# Patient Record
Sex: Male | Born: 1985 | Race: White | Hispanic: No | Marital: Single | Smoking: Current every day smoker
Health system: Southern US, Community
[De-identification: ages and names within clinical notes are randomized; demographics above are authoritative.]

## PROBLEM LIST (undated history)

## (undated) DIAGNOSIS — M359 Systemic involvement of connective tissue, unspecified: Secondary | ICD-10-CM

## (undated) DIAGNOSIS — F410 Panic disorder [episodic paroxysmal anxiety] without agoraphobia: Secondary | ICD-10-CM

---

## 2004-10-22 ENCOUNTER — Emergency Department: Payer: Self-pay | Admitting: Unknown Physician Specialty

## 2004-11-11 ENCOUNTER — Emergency Department: Payer: Self-pay | Admitting: Emergency Medicine

## 2005-01-27 ENCOUNTER — Emergency Department (HOSPITAL_COMMUNITY): Admission: EM | Admit: 2005-01-27 | Discharge: 2005-01-27 | Payer: Self-pay | Admitting: Family Medicine

## 2006-11-29 ENCOUNTER — Emergency Department: Payer: Self-pay | Admitting: General Practice

## 2007-02-01 ENCOUNTER — Emergency Department: Payer: Self-pay | Admitting: Emergency Medicine

## 2007-04-13 ENCOUNTER — Emergency Department: Payer: Self-pay | Admitting: Emergency Medicine

## 2007-09-28 ENCOUNTER — Emergency Department: Payer: Self-pay | Admitting: Emergency Medicine

## 2007-10-05 ENCOUNTER — Emergency Department: Payer: Self-pay | Admitting: Emergency Medicine

## 2008-02-27 ENCOUNTER — Emergency Department: Payer: Self-pay | Admitting: Unknown Physician Specialty

## 2008-03-25 ENCOUNTER — Emergency Department: Payer: Self-pay | Admitting: Emergency Medicine

## 2008-05-05 ENCOUNTER — Emergency Department: Payer: Self-pay | Admitting: Emergency Medicine

## 2008-05-07 ENCOUNTER — Emergency Department: Payer: Self-pay | Admitting: Emergency Medicine

## 2009-11-29 ENCOUNTER — Emergency Department: Payer: Self-pay | Admitting: Emergency Medicine

## 2009-12-01 ENCOUNTER — Emergency Department: Payer: Self-pay | Admitting: Internal Medicine

## 2009-12-03 ENCOUNTER — Emergency Department: Payer: Self-pay | Admitting: Internal Medicine

## 2010-04-18 ENCOUNTER — Emergency Department (HOSPITAL_COMMUNITY): Admission: EM | Admit: 2010-04-18 | Discharge: 2010-04-18 | Payer: Self-pay | Admitting: Emergency Medicine

## 2010-04-27 ENCOUNTER — Emergency Department: Payer: Self-pay | Admitting: Emergency Medicine

## 2010-04-29 ENCOUNTER — Emergency Department (HOSPITAL_COMMUNITY): Admission: EM | Admit: 2010-04-29 | Discharge: 2010-04-29 | Payer: Self-pay | Admitting: Emergency Medicine

## 2010-04-30 ENCOUNTER — Inpatient Hospital Stay (HOSPITAL_COMMUNITY): Admission: EM | Admit: 2010-04-30 | Discharge: 2010-05-04 | Payer: Self-pay | Admitting: Internal Medicine

## 2010-06-28 ENCOUNTER — Emergency Department (HOSPITAL_COMMUNITY): Admission: EM | Admit: 2010-06-28 | Discharge: 2010-06-28 | Payer: Self-pay | Admitting: Emergency Medicine

## 2010-07-01 ENCOUNTER — Emergency Department: Payer: Self-pay | Admitting: Emergency Medicine

## 2010-07-23 ENCOUNTER — Emergency Department: Payer: Self-pay | Admitting: Emergency Medicine

## 2010-07-29 ENCOUNTER — Emergency Department: Payer: Self-pay | Admitting: Emergency Medicine

## 2010-07-31 ENCOUNTER — Emergency Department: Payer: Self-pay | Admitting: Emergency Medicine

## 2010-07-31 ENCOUNTER — Emergency Department (HOSPITAL_COMMUNITY)
Admission: EM | Admit: 2010-07-31 | Discharge: 2010-07-31 | Payer: Self-pay | Source: Home / Self Care | Admitting: Emergency Medicine

## 2010-11-16 LAB — CBC
HCT: 35.7 % — ABNORMAL LOW (ref 39.0–52.0)
HCT: 36.2 % — ABNORMAL LOW (ref 39.0–52.0)
Hemoglobin: 12.9 g/dL — ABNORMAL LOW (ref 13.0–17.0)
Hemoglobin: 13.2 g/dL (ref 13.0–17.0)
MCH: 30 pg (ref 26.0–34.0)
MCH: 30.1 pg (ref 26.0–34.0)
MCHC: 34.5 g/dL (ref 30.0–36.0)
MCHC: 34.7 g/dL (ref 30.0–36.0)
MCHC: 35.6 g/dL (ref 30.0–36.0)
MCV: 84.6 fL (ref 78.0–100.0)
MCV: 86.4 fL (ref 78.0–100.0)
Platelets: 156 10*3/uL (ref 150–400)
Platelets: 171 10*3/uL (ref 150–400)
RBC: 4.22 MIL/uL (ref 4.22–5.81)
RBC: 4.28 MIL/uL (ref 4.22–5.81)
RDW: 12 % (ref 11.5–15.5)
RDW: 12.2 % (ref 11.5–15.5)
WBC: 10.2 10*3/uL (ref 4.0–10.5)
WBC: 13.8 10*3/uL — ABNORMAL HIGH (ref 4.0–10.5)

## 2010-11-16 LAB — DIFFERENTIAL
Basophils Absolute: 0 10*3/uL (ref 0.0–0.1)
Basophils Relative: 0 % (ref 0–1)
Basophils Relative: 0 % (ref 0–1)
Basophils Relative: 0 % (ref 0–1)
Eosinophils Absolute: 0 10*3/uL (ref 0.0–0.7)
Eosinophils Relative: 0 % (ref 0–5)
Lymphocytes Relative: 9 % — ABNORMAL LOW (ref 12–46)
Lymphs Abs: 1.2 10*3/uL (ref 0.7–4.0)
Lymphs Abs: 1.2 10*3/uL (ref 0.7–4.0)
Monocytes Absolute: 1.2 10*3/uL — ABNORMAL HIGH (ref 0.1–1.0)
Monocytes Absolute: 2 10*3/uL — ABNORMAL HIGH (ref 0.1–1.0)
Monocytes Relative: 15 % — ABNORMAL HIGH (ref 3–12)
Neutro Abs: 5.9 10*3/uL (ref 1.7–7.7)
Neutrophils Relative %: 70 % (ref 43–77)

## 2010-11-16 LAB — COMPREHENSIVE METABOLIC PANEL
ALT: 24 U/L (ref 0–53)
AST: 28 U/L (ref 0–37)
Alkaline Phosphatase: 81 U/L (ref 39–117)
CO2: 30 mEq/L (ref 19–32)
Calcium: 8.5 mg/dL (ref 8.4–10.5)
Calcium: 8.9 mg/dL (ref 8.4–10.5)
Creatinine, Ser: 0.66 mg/dL (ref 0.4–1.5)
Creatinine, Ser: 0.89 mg/dL (ref 0.4–1.5)
GFR calc Af Amer: >60 mL/min (ref >60)
GFR calc Af Amer: >60 mL/min (ref >60)
GFR calc non Af Amer: >60 mL/min (ref >60)
Sodium: 135 mEq/L (ref 135–145)
Total Protein: 6.2 g/dL (ref 6.0–8.3)

## 2010-11-16 LAB — URINALYSIS, ROUTINE W REFLEX MICROSCOPIC
Protein, ur: 100 mg/dL — AB
Urobilinogen, UA: 4 mg/dL — ABNORMAL HIGH (ref 0.0–1.0)
pH: 6 (ref 5.0–8.0)

## 2010-11-16 LAB — BASIC METABOLIC PANEL
BUN: 5 mg/dL — ABNORMAL LOW (ref 6–23)
CO2: 27 mEq/L (ref 19–32)
Calcium: 8.5 mg/dL (ref 8.4–10.5)
Calcium: 9 mg/dL (ref 8.4–10.5)
GFR calc Af Amer: >60 mL/min (ref >60)
GFR calc Af Amer: >60 mL/min (ref >60)
GFR calc non Af Amer: >60 mL/min (ref >60)
Glucose, Bld: 102 mg/dL — ABNORMAL HIGH (ref 70–99)
Potassium: 3.6 mEq/L (ref 3.5–5.1)
Sodium: 133 mEq/L — ABNORMAL LOW (ref 135–145)
Sodium: 136 mEq/L (ref 135–145)

## 2010-11-16 LAB — CULTURE, BLOOD (ROUTINE X 2): Culture: NO GROWTH

## 2010-11-16 LAB — GC/CHLAMYDIA PROBE AMP, URINE: GC Probe Amp, Urine: POSITIVE — AB

## 2010-11-16 LAB — RPR: RPR Ser Ql: NONREACTIVE

## 2010-11-16 LAB — URINE MICROSCOPIC-ADD ON

## 2010-11-16 LAB — HEPATIC FUNCTION PANEL
ALT: 19 U/L (ref 0–53)
AST: 24 U/L (ref 0–37)
Alkaline Phosphatase: 60 U/L (ref 39–117)
Total Bilirubin: 1.3 mg/dL — ABNORMAL HIGH (ref 0.3–1.2)

## 2010-11-16 LAB — ABO/RH: ABO/RH(D): A POS

## 2010-11-16 LAB — URINE CULTURE
Culture  Setup Time: 201108290934
Culture: NO GROWTH

## 2010-11-16 LAB — STREP A DNA PROBE

## 2010-11-16 LAB — MAGNESIUM: Magnesium: 2 mg/dL (ref 1.5–2.5)

## 2011-04-10 ENCOUNTER — Emergency Department: Payer: Self-pay | Admitting: Emergency Medicine

## 2011-04-29 ENCOUNTER — Emergency Department: Payer: Self-pay | Admitting: Emergency Medicine

## 2011-05-16 ENCOUNTER — Emergency Department: Payer: Self-pay | Admitting: Emergency Medicine

## 2011-06-23 ENCOUNTER — Emergency Department: Payer: Self-pay | Admitting: Unknown Physician Specialty

## 2011-10-13 IMAGING — CR DG CHEST 2V
2 series · 2 of 2 positions shown · non-contrast
Comparison: None

CLINICAL DATA: Fever, sore throat.

CHEST - 2 VIEW

[w chest pa]
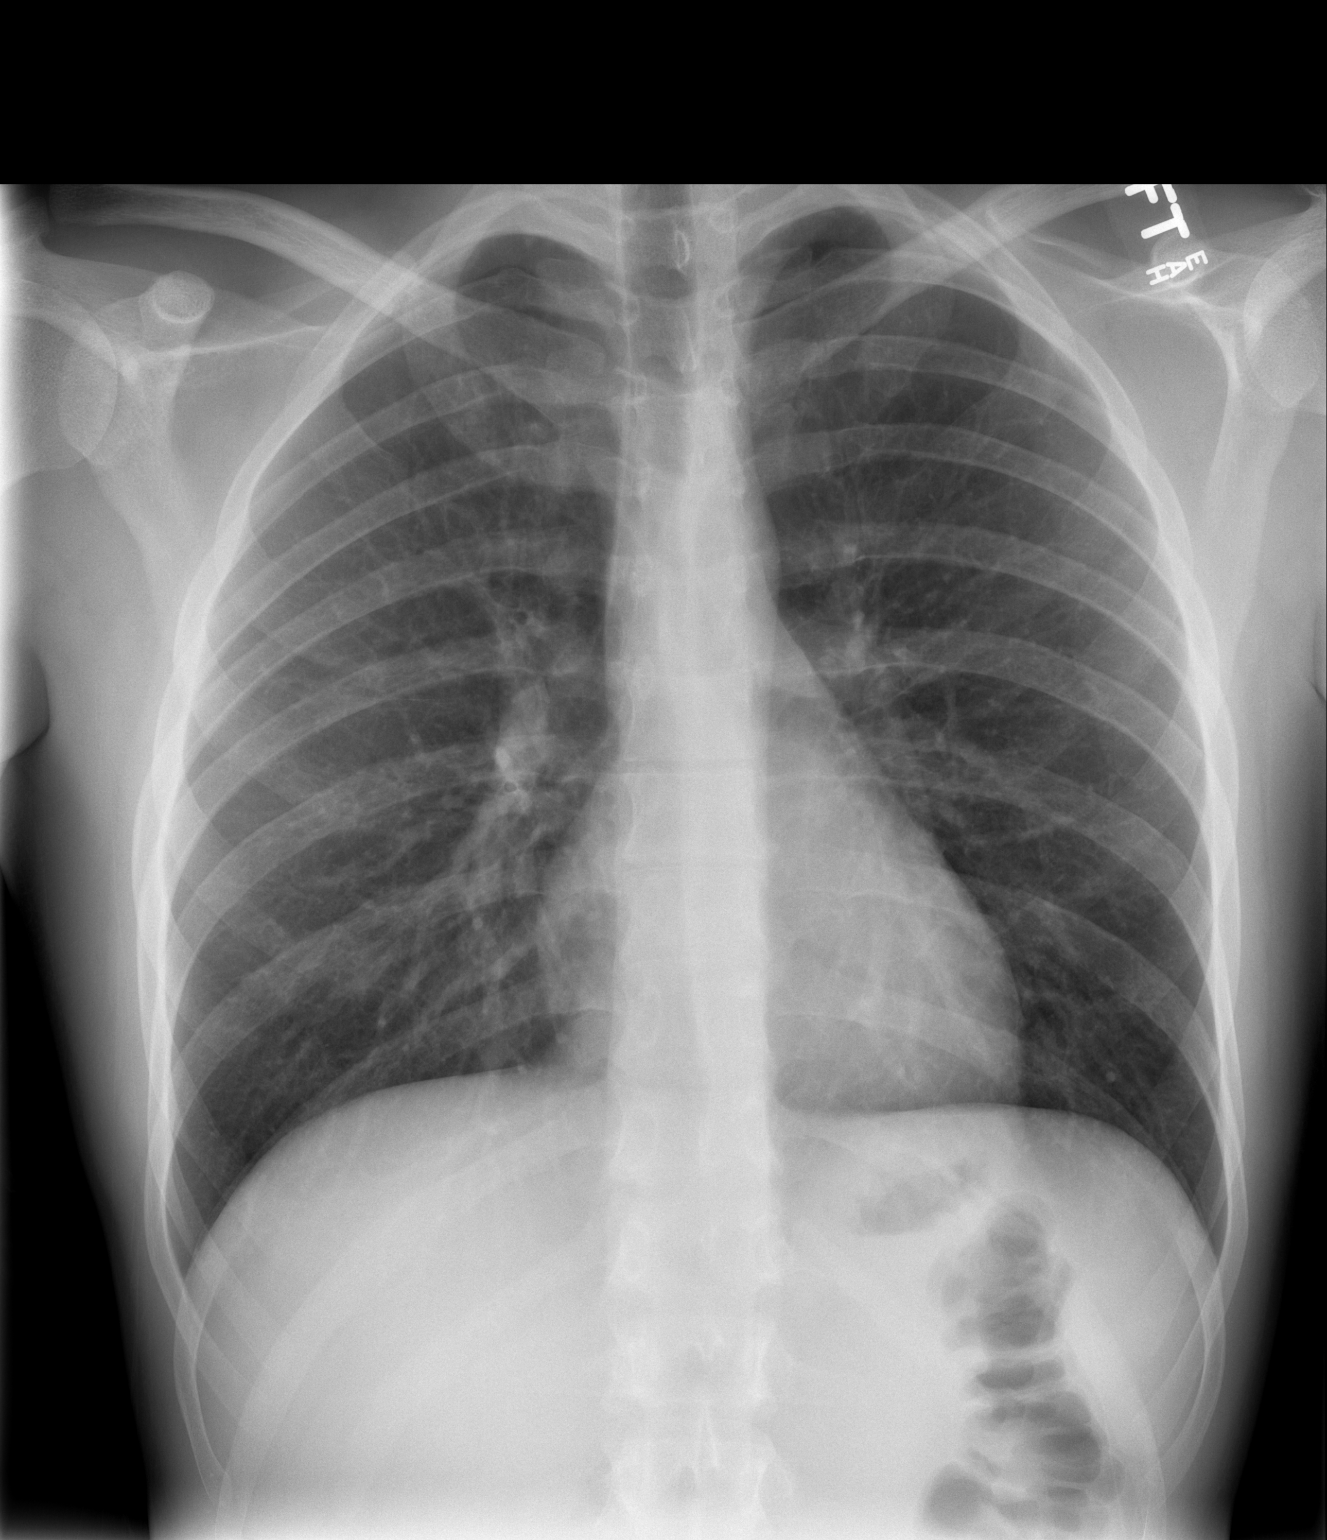

[w chest lat]
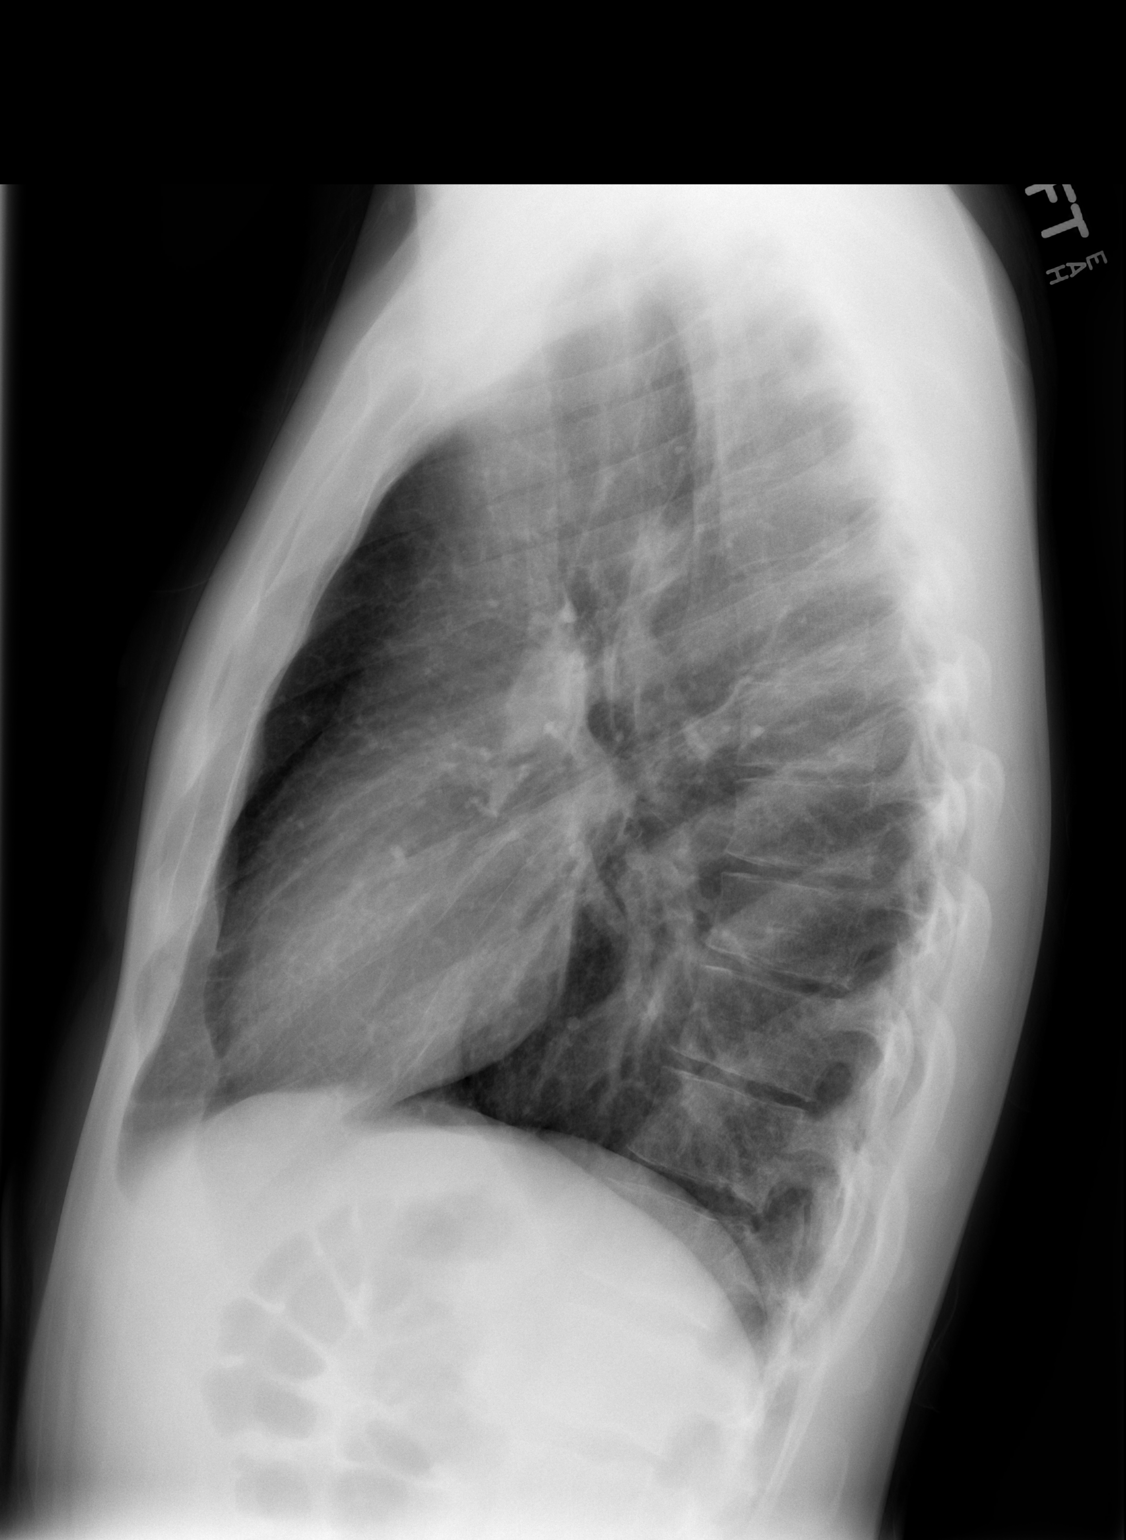

[2 of 2 positions shown; findings below may reference images not displayed]

FINDINGS: Heart and mediastinal contours are within normal limits.
No focal opacities or effusions.  No acute bony abnormality.
IMPRESSION: No active disease.

## 2011-10-14 IMAGING — CT CT ABD-PELV W/ CM
3 of 4 series · 16 of 46 positions shown, 20 images · IV contrast (APPLIED)
Comparison: None.

CLINICAL DATA: Hematemesis/nausea and vomiting

CT ABDOMEN AND PELVIS WITH CONTRAST
TECHNIQUE: Multidetector CT imaging of the abdomen and pelvis was
performed following the standard protocol during bolus
administration of intravenous contrast.
Contrast: 100 ml Pmnipaque-1BB

[Series 2: abd/pelv with 5.0 b31f st · axial · 0.68mm/px · z∈[-462,-57]mm · 12 of 93 slices shown, 16 images]
[im 8/93  soft-tissue]
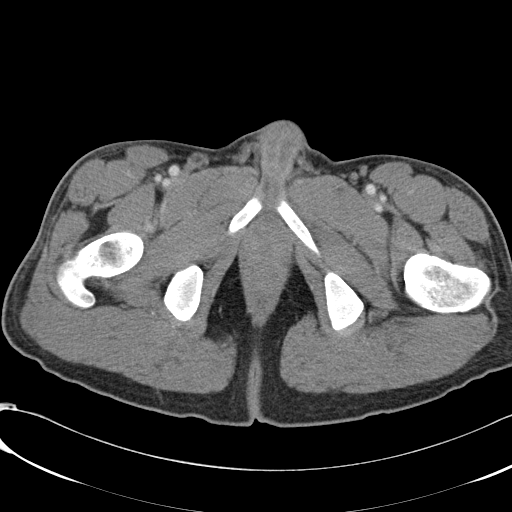
[im 8/93  bone]
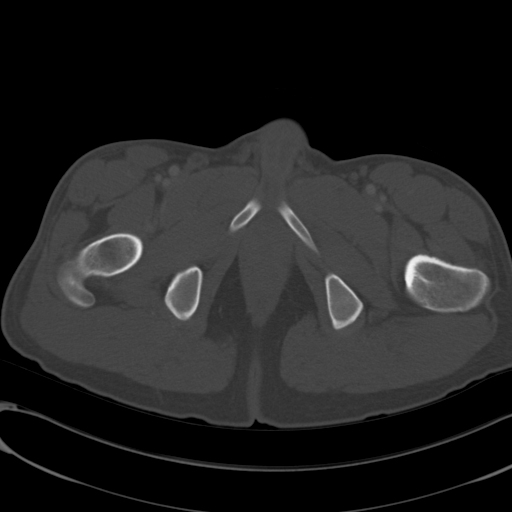
[im 16/93  soft-tissue]
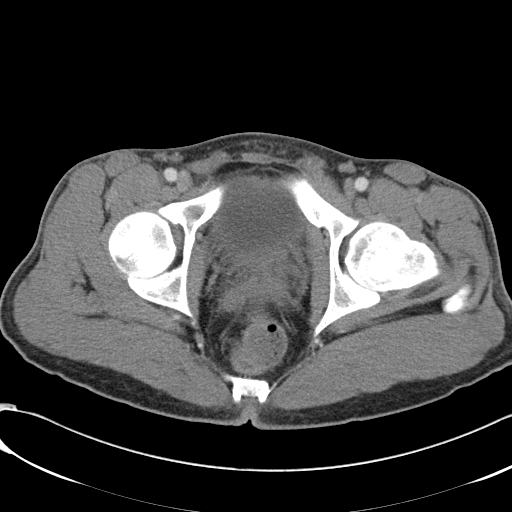
[im 24/93  soft-tissue]
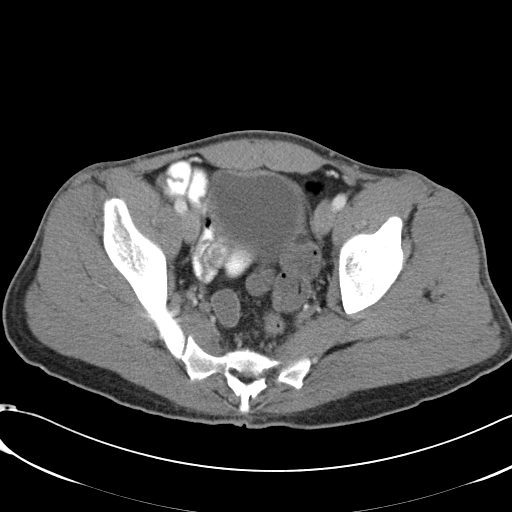
[im 35/93  soft-tissue]
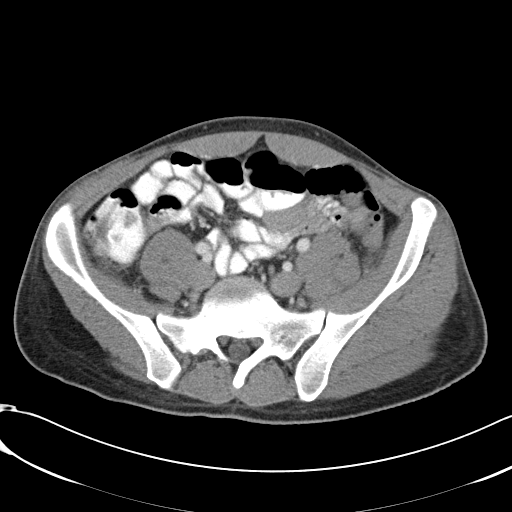
[im 43/93  soft-tissue]
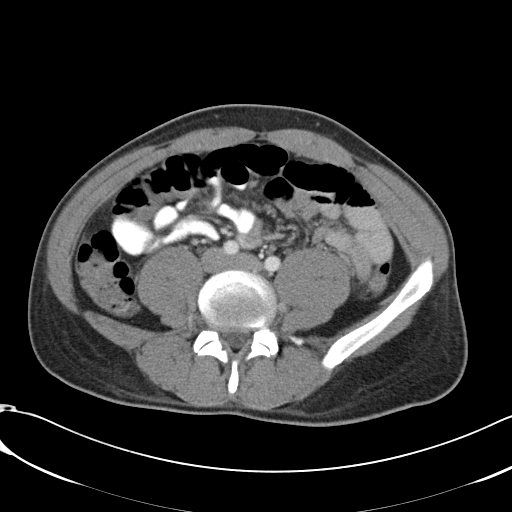
[im 50/93  soft-tissue]
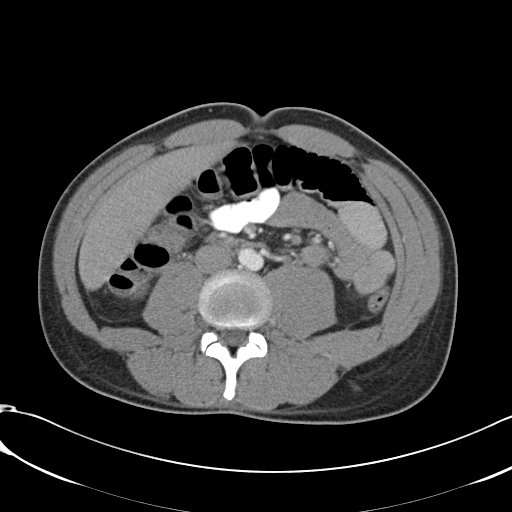
[im 58/93  soft-tissue]
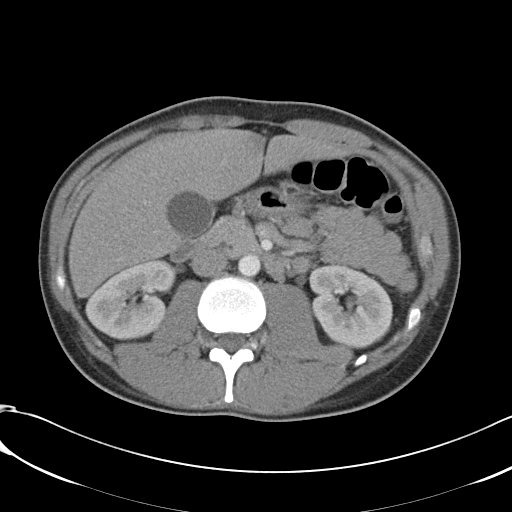
[im 70/93  soft-tissue]
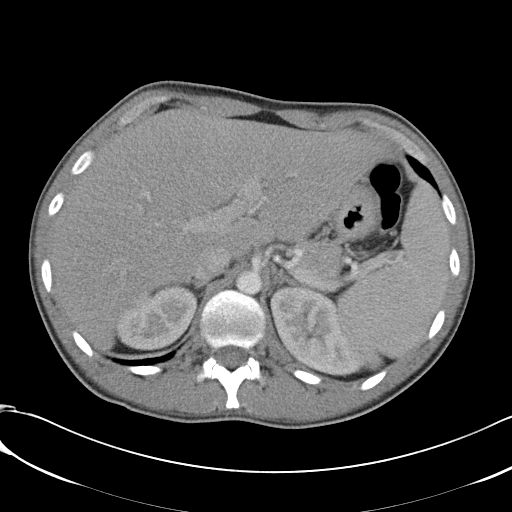
[im 77/93  soft-tissue]
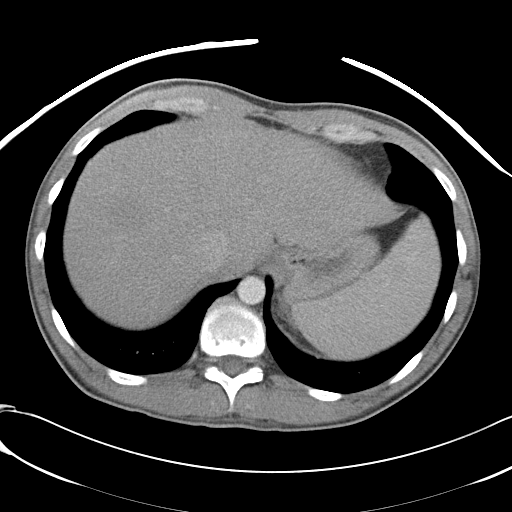
[im 77/93  lung]
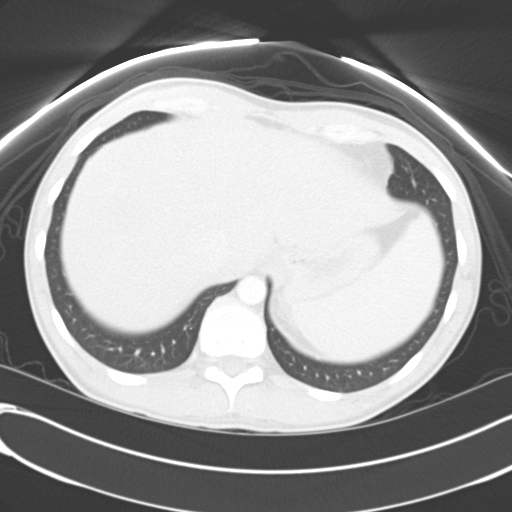
[im 77/93  bone]
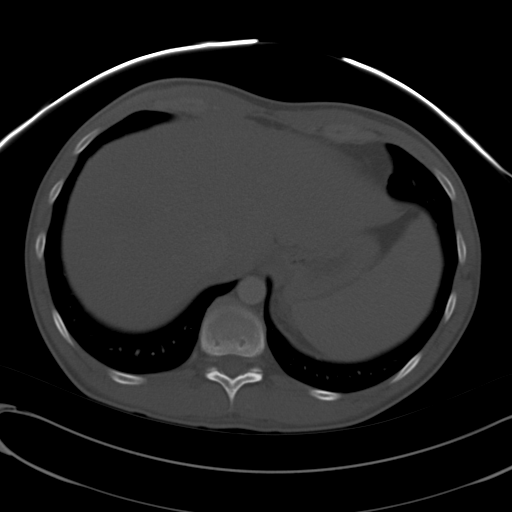
[im 81/93  lung]
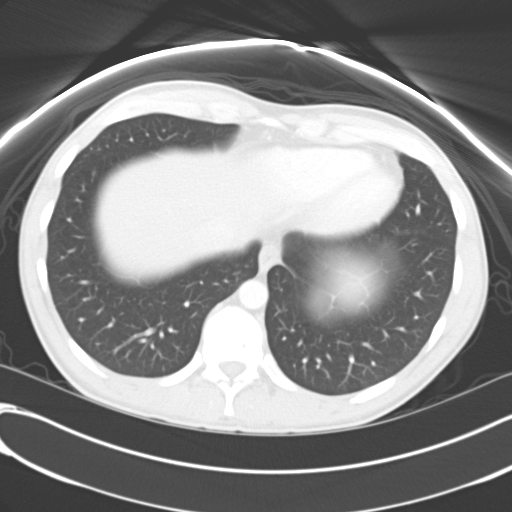
[im 85/93  soft-tissue]
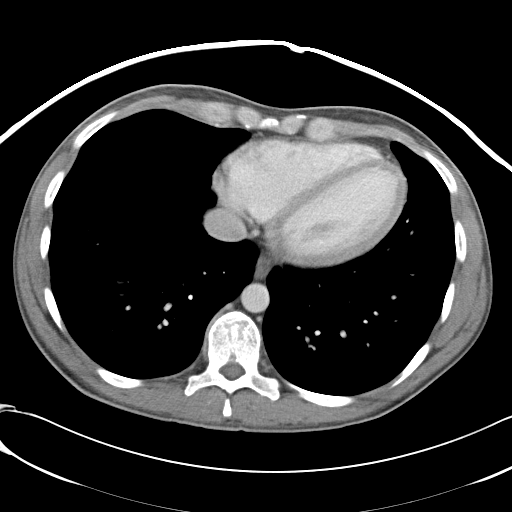
[im 85/93  lung]
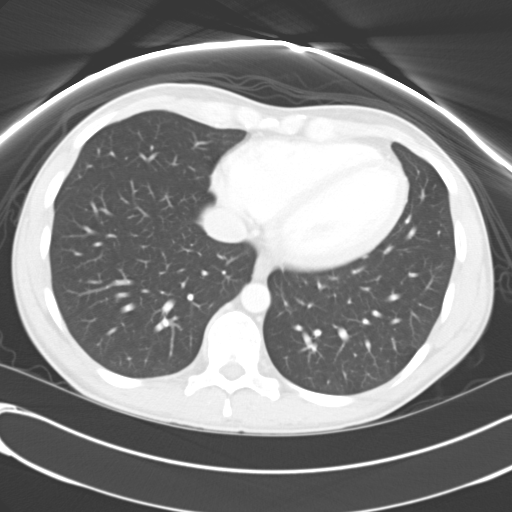
[im 89/93  lung]
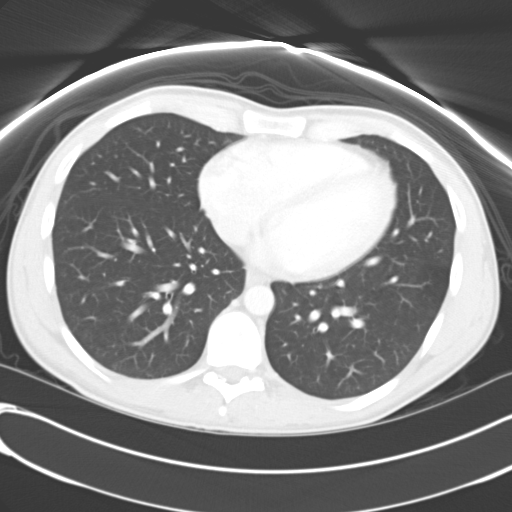

[Series 5: abd/pelv with 2.0 cor · coronal · 0.91mm/px · 3 of 96 slices shown]
[im 32/96  soft-tissue]
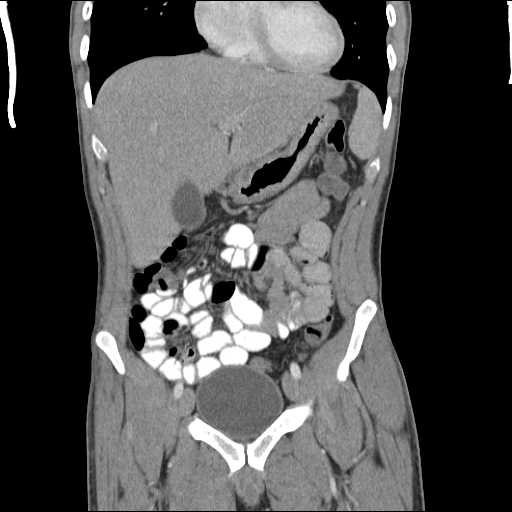
[im 43/96  soft-tissue]
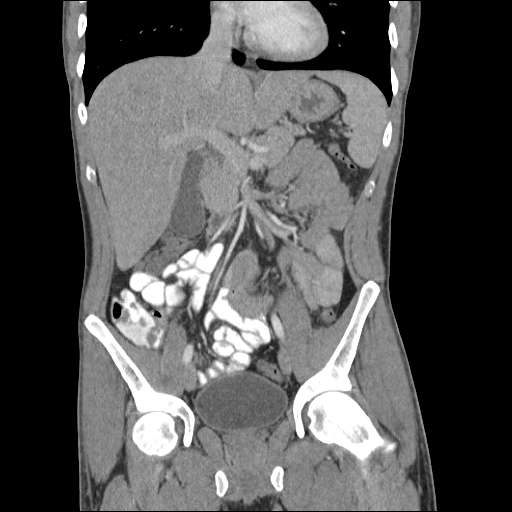
[im 53/96  soft-tissue]
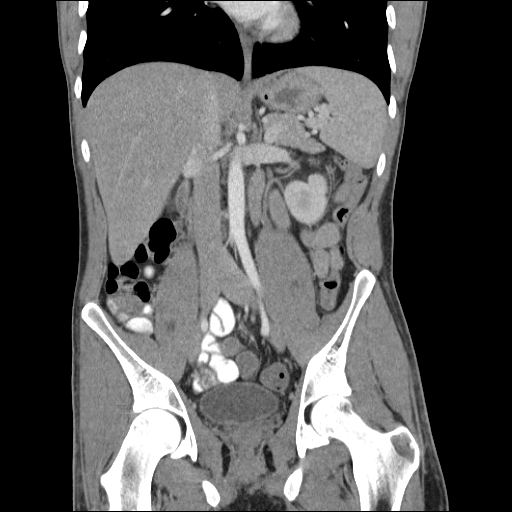

[Series 6: abd/pelv with 3.0 spo sag st · sagittal · 0.91mm/px · 1 of 99 slices shown]
[im 33/99  soft-tissue]
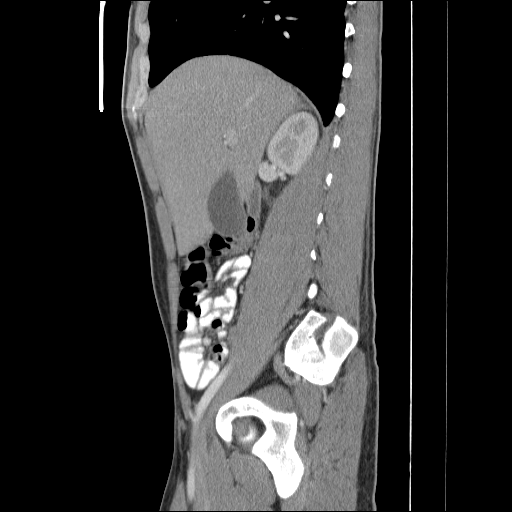

[16 of 46 positions shown; findings below may reference images not displayed]

FINDINGS: The lung bases are clear.  No focal lesions of the liver,
spleen, pancreas, or adrenal glands.  No renal lesions identified.
No calcified gallstones or biliary dilatation.

No pathology noted of the large or small bowel.  No masses or fluid
collections.  No adenopathy.
IMPRESSION: No pathological findings.

## 2011-12-10 ENCOUNTER — Emergency Department: Payer: Self-pay | Admitting: *Deleted

## 2012-02-08 ENCOUNTER — Emergency Department: Payer: Self-pay | Admitting: *Deleted

## 2012-10-26 ENCOUNTER — Emergency Department: Payer: Self-pay | Admitting: Emergency Medicine

## 2012-11-04 ENCOUNTER — Emergency Department: Payer: Self-pay | Admitting: Emergency Medicine

## 2012-12-23 ENCOUNTER — Emergency Department: Payer: Self-pay | Admitting: Emergency Medicine

## 2012-12-25 ENCOUNTER — Inpatient Hospital Stay: Payer: Self-pay | Admitting: Internal Medicine

## 2012-12-25 LAB — CBC WITH DIFFERENTIAL/PLATELET
Basophil #: 0.1 10*3/uL (ref 0.0–0.1)
Basophil %: 0.5 %
Eosinophil %: 0.2 %
HGB: 14.1 g/dL (ref 13.0–18.0)
Lymphocyte #: 1.7 10*3/uL (ref 1.0–3.6)
MCH: 30.1 pg (ref 26.0–34.0)
MCHC: 33.7 g/dL (ref 32.0–36.0)
Monocyte #: 1.4 x10 3/mm — ABNORMAL HIGH (ref 0.2–1.0)
Neutrophil %: 75.1 %
Platelet: 230 10*3/uL (ref 150–440)
RDW: 13.3 % (ref 11.5–14.5)

## 2012-12-25 LAB — SYNOVIAL CELL COUNT + DIFF, W/ CRYSTALS
Crystals, Joint Fluid: NONE SEEN
Eosinophil: 0 %
Lymphocytes: 11 %
Neutrophils: 11 %
Other Mononuclear Cells: 78 %

## 2012-12-25 LAB — SEDIMENTATION RATE: Erythrocyte Sed Rate: 2 mm/hr (ref 0–15)

## 2012-12-25 LAB — COMPREHENSIVE METABOLIC PANEL
Alkaline Phosphatase: 92 U/L (ref 50–136)
Anion Gap: 5 — ABNORMAL LOW (ref 7–16)
BUN: 11 mg/dL (ref 7–18)
Calcium, Total: 8.6 mg/dL (ref 8.5–10.1)
Co2: 27 mmol/L (ref 21–32)
EGFR (Non-African Amer.): >60
Glucose: 69 mg/dL (ref 65–99)
Potassium: 3.4 mmol/L — ABNORMAL LOW (ref 3.5–5.1)
SGPT (ALT): 25 U/L (ref 12–78)
Sodium: 134 mmol/L — ABNORMAL LOW (ref 136–145)
Total Protein: 7.5 g/dL (ref 6.4–8.2)

## 2012-12-25 LAB — URINALYSIS, COMPLETE
Glucose,UR: NEGATIVE mg/dL (ref 0–75)
Ketone: NEGATIVE
Leukocyte Esterase: NEGATIVE
Specific Gravity: 1.006 (ref 1.003–1.030)
WBC UR: <1 /HPF (ref 0–5)

## 2012-12-26 LAB — CBC WITH DIFFERENTIAL/PLATELET
Basophil %: 0.2 %
Eosinophil #: 0 10*3/uL (ref 0.0–0.7)
Eosinophil %: 0.2 %
Lymphocyte #: 1.5 10*3/uL (ref 1.0–3.6)
Lymphocyte %: 11.7 %
MCHC: 33.3 g/dL (ref 32.0–36.0)
Monocyte #: 1.6 x10 3/mm — ABNORMAL HIGH (ref 0.2–1.0)
Monocyte %: 12.2 %
Platelet: 228 10*3/uL (ref 150–440)
RBC: 4.55 10*6/uL (ref 4.40–5.90)
RDW: 13.5 % (ref 11.5–14.5)
WBC: 13.1 10*3/uL — ABNORMAL HIGH (ref 3.8–10.6)

## 2012-12-26 LAB — BASIC METABOLIC PANEL
Anion Gap: 7 (ref 7–16)
BUN: 7 mg/dL (ref 7–18)
Calcium, Total: 8.6 mg/dL (ref 8.5–10.1)
Osmolality: 263 (ref 275–301)
Sodium: 133 mmol/L — ABNORMAL LOW (ref 136–145)

## 2012-12-27 LAB — CBC WITH DIFFERENTIAL/PLATELET
Basophil %: 0.7 %
Eosinophil #: 0.1 10*3/uL (ref 0.0–0.7)
Eosinophil %: 0.6 %
HGB: 12.9 g/dL — ABNORMAL LOW (ref 13.0–18.0)
Lymphocyte %: 14.6 %
MCH: 30.1 pg (ref 26.0–34.0)
MCV: 90 fL (ref 80–100)
Monocyte #: 1 x10 3/mm (ref 0.2–1.0)
Monocyte %: 11.9 %

## 2012-12-28 ENCOUNTER — Emergency Department: Payer: Self-pay | Admitting: Emergency Medicine

## 2012-12-28 LAB — CBC WITH DIFFERENTIAL/PLATELET
Eosinophil %: 0.6 %
HCT: 39.1 % — ABNORMAL LOW (ref 40.0–52.0)
HGB: 13.3 g/dL (ref 13.0–18.0)
MCV: 88 fL (ref 80–100)
Neutrophil #: 6.6 10*3/uL — ABNORMAL HIGH (ref 1.4–6.5)
Neutrophil %: 77.8 %
Platelet: 286 10*3/uL (ref 150–440)
RBC: 4.43 10*6/uL (ref 4.40–5.90)
WBC: 8.5 10*3/uL (ref 3.8–10.6)

## 2012-12-29 LAB — BODY FLUID CULTURE

## 2012-12-31 LAB — CULTURE, BLOOD (SINGLE)

## 2013-01-03 LAB — CULTURE, BLOOD (SINGLE)

## 2013-01-22 ENCOUNTER — Emergency Department: Payer: Self-pay | Admitting: Emergency Medicine

## 2013-01-29 ENCOUNTER — Emergency Department: Payer: Self-pay | Admitting: Unknown Physician Specialty

## 2013-07-15 ENCOUNTER — Emergency Department: Payer: Self-pay | Admitting: Emergency Medicine

## 2013-08-18 ENCOUNTER — Emergency Department: Payer: Self-pay | Admitting: Emergency Medicine

## 2014-07-09 ENCOUNTER — Emergency Department: Payer: Self-pay | Admitting: Emergency Medicine

## 2014-12-23 NOTE — Consult Note (Signed)
PATIENT NAME:  Justin Ramirez, Justin Ramirez MR#:  [id number] DATE OF BIRTH:  February 02, 1986  EMERGENCY ROOM NOTE  CONSULTING PHYSICIAN:  Claud Kelp, MD  DATE OF CONSULTATION:  12/25/2012  HISTORY OF PRESENT ILLNESS:  The patient is a 29 year old male who presented 2 days ago with erythema over his right anterior knee, and tenderness. He was given some oral antibiotic at that time and sent out. He returns today with worsening pain, and now a small, approximately 1 cm diameter ulceration overlying the patellar tendon. He still has surrounding erythema as well, and increasing tenderness about the anterior knee. He denies any intravenous drug abuse, and denies any known trauma to that region.   On physical examination, he does have an area of well-circumscribed erythema and tenderness overlying his right anterior knee, with the small open wound overlying the patellar tendon, as previously described. He is able to extend to 0 degrees, flex to approximately 50 degrees, with considerable discomfort overlying his anterior knee. He is neurovascularly intact distally at the right lower extremity. He has no pain about the ankle or hip.   LABORATORY EVALUATION:  ESR was 2. CRP is pending. White count is 12.8.   VITAL SIGNS ON ADMISSION:  Patient was afebrile, with stable vital signs.  RADIOGRAPHS:  X-rays taken demonstrate significant prepatellar edema as well as suspicious for knee joint effusion.   ASSESSMENT:  A 29 year old male with septic prepatellar bursitis. The patient, under sterile technique, through an anterior lateral approach, knee aspiration was performed to rule out septic arthritis, and 10 mL of clear serous fluid was obtained, which did not appear infectious, but was sent off for cell count, crystal analysis, Gram stain and culture. We are still awaiting the results of that fluid analysis at this time. The patient's open wound was decompressed and packed with Iodoform gauze. There was minimal purulent  drainage expressed from the wound.   PLAN:  Will be to await results of the arthrocentesis. If no intra-articular infection, patient will be admitted for IV antibiotics and b.i.d. daily dressing changes, and monitoring for improvement of his septic bursitis. However, if the arthrocentesis is positive for infection, patient will be taken to the Operating Room this evening for arthroscopic I and D of the right knee.    ____________________________ Claud Kelp, MD tte:mr D: 12/25/2012 21:23:50 ET T: 12/25/2012 22:20:38 ET JOB#: [id number]  cc: Claud Kelp, MD, <Dictator> Claud Kelp MD ELECTRONICALLY SIGNED 12/27/2012 11:26
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
## 2014-12-23 NOTE — Discharge Summary (Signed)
PATIENT NAME:  Justin Ramirez, Justin Ramirez MR#:  [id number] DATE OF BIRTH:  Jul 20, 1986  DATE OF ADMISSION:  12/25/2012 DATE OF DISCHARGE: 12/27/2012   PRIMARY CARE PHYSICIAN: None local.   CONSULTATIONS: Orthopedic surgery, Dr. Rodena MedinEckel.   PROCEDURE: arthorcenthesis FINAL DIAGNOSES: 1.  Right parapatellar  bursitis/cellulitis.  2.  Leukocytosis.  3.  Hyponatremia.  4.  Tobacco abuse.   CONDITION: Stable.  CODE STATUS: Full code.   HOME MEDICATIONS:  1.  Norco 5 mg/325 mg p.o. q. 4 to 6 hours p.Ramirez.n.  2.  Clindamycin 300 mg p.o. every 6 hours for 10 days.   DIET: Regular diet.   ACTIVITY: As tolerated.   FOLLOWUP CARE: Follow up with his surgeon within 1 week and also the patient needs wound care.   REASON FOR ADMISSION: Worsening right knee pain and swelling.   HOSPITAL COURSE: The patient is a 29 year old Caucasian male with a history of rheumatoid arthritis, who was on prednisone on and off, who presented to the ED with worsening right knee pain and swelling. The patient was noted to have leukocytosis at 12.8 in the ED. The patient was seen by orthopedic surgeon in the area. It was drained and also packed. Synovial fluid analysis did not show septic joint. Gram stain so far is negative. The patient was admitted for bursitis and cellulitis. After admission, the patient had been treated with vancomycin and IV fluid support. His white count decreased from normal range. His right knee pain has much improved. He is clinically stable and will be discharged to home today. Discussed the patient's discharge plan with the patient and the patient's girlfriend.    TIME SPENT: About 36 minutes.  ____________________________ Shaune PollackQing Cinque Begley, MD qc:aw D: 12/27/2012 11:24:02 ET T: 12/27/2012 11:41:52 ET JOB#: [id number]  cc: Shaune PollackQing Suhayb Anzalone, MD, <Dictator> Shaune PollackQING Aleeza [place] MD ELECTRONICALLY SIGNED 12/27/2012 12:10

## 2017-01-09 ENCOUNTER — Emergency Department
Admission: EM | Admit: 2017-01-09 | Discharge: 2017-01-10 | Disposition: A | Discharge status: Home / Self Care | Payer: Self-pay | Attending: Emergency Medicine | Admitting: Emergency Medicine

## 2017-01-09 ENCOUNTER — Encounter: Payer: Self-pay | Admitting: Emergency Medicine

## 2017-01-09 DIAGNOSIS — F919 Conduct disorder, unspecified: Secondary | ICD-10-CM | POA: Insufficient documentation

## 2017-01-09 DIAGNOSIS — F1210 Cannabis abuse, uncomplicated: Secondary | ICD-10-CM | POA: Insufficient documentation

## 2017-01-09 DIAGNOSIS — F17210 Nicotine dependence, cigarettes, uncomplicated: Secondary | ICD-10-CM | POA: Insufficient documentation

## 2017-01-09 DIAGNOSIS — R4689 Other symptoms and signs involving appearance and behavior: Secondary | ICD-10-CM

## 2017-01-09 HISTORY — DX: Panic disorder (episodic paroxysmal anxiety): F41.0

## 2017-01-09 LAB — URINE DRUG SCREEN, QUALITATIVE (ARMC ONLY)
AMPHETAMINES, UR SCREEN: NOT DETECTED
BARBITURATES, UR SCREEN: NOT DETECTED
BENZODIAZEPINE, UR SCRN: NOT DETECTED
Cannabinoid 50 Ng, Ur ~~LOC~~: POSITIVE — AB
Cocaine Metabolite,Ur ~~LOC~~: NOT DETECTED
MDMA (Ecstasy)Ur Screen: NOT DETECTED
Methadone Scn, Ur: NOT DETECTED
OPIATE, UR SCREEN: NOT DETECTED
PHENCYCLIDINE (PCP) UR S: NOT DETECTED
Tricyclic, Ur Screen: NOT DETECTED

## 2017-01-09 LAB — COMPREHENSIVE METABOLIC PANEL
ALK PHOS: 77 U/L (ref 38–126)
ALT: 24 U/L (ref 17–63)
AST: 26 U/L (ref 15–41)
Albumin: 4.4 g/dL (ref 3.5–5.0)
Anion gap: 8 (ref 5–15)
BUN: 11 mg/dL (ref 6–20)
CALCIUM: 9.2 mg/dL (ref 8.9–10.3)
CO2: 25 mmol/L (ref 22–32)
CREATININE: 1 mg/dL (ref 0.61–1.24)
Chloride: 103 mmol/L (ref 101–111)
Glucose, Bld: 113 mg/dL — ABNORMAL HIGH (ref 65–99)
Potassium: 3.5 mmol/L (ref 3.5–5.1)
Sodium: 136 mmol/L (ref 135–145)
Total Bilirubin: 1.1 mg/dL (ref 0.3–1.2)
Total Protein: 7.7 g/dL (ref 6.5–8.1)

## 2017-01-09 LAB — CBC
HCT: 41.5 % (ref 40.0–52.0)
Hemoglobin: 14.5 g/dL (ref 13.0–18.0)
MCH: 29.1 pg (ref 26.0–34.0)
MCHC: 34.8 g/dL (ref 32.0–36.0)
MCV: 83.5 fL (ref 80.0–100.0)
PLATELETS: 242 10*3/uL (ref 150–440)
RBC: 4.97 MIL/uL (ref 4.40–5.90)
RDW: 13.2 % (ref 11.5–14.5)
WBC: 7.4 10*3/uL (ref 3.8–10.6)

## 2017-01-09 LAB — ETHANOL

## 2017-01-09 NOTE — ED Notes (Signed)
Pt flinches and squints when lights turned on. Pt turns head with eyes shut and will not verbally respond.

## 2017-01-09 NOTE — ED Notes (Signed)
Pt woke up when moved pt's phone; pt stated "dont take my phone", explained to pt I was moving it so it would not fall

## 2017-01-09 NOTE — ED Notes (Signed)
pt with suspected overdose; family called out for panic attack; pt would intermittently talk to myself and stated that his panic attacks present themselves by him being unresponsive; when trying to assess further, pt refusing to answer questions but will open eyes with painful stimuli and will wake up if phone is touched

## 2017-01-09 NOTE — ED Provider Notes (Signed)
[facility] Emergency Department Provider Note  ____________________________________________   I have reviewed the triage vital signs and the nursing notes.   HISTORY  Chief Complaint Drug Overdose   History limited by: Altered Mental Status   HPI Justin Ramirez is a 31 y.o. male who presents to the emergency department today via EMS. The patient appears to be intoxicated and/or altered and is non verbal, thus cannot give any history. EMS reports that they were called out for a panic attack. Apparently the patient expressed to his companions that when he gets panic attacks he becomes nonverbal and unresponsive.   Past Medical History:  Diagnosis Date  . Panic attack     There are no active problems to display for this patient.   History reviewed. No pertinent surgical history.  Prior to Admission medications   Not on File    Allergies Patient has no known allergies.  No family history on file.  Social History Social History  Substance Use Topics  . Smoking status: Current Every Day Smoker    Types: Cigarettes  . Smokeless tobacco: Not on file  . Alcohol use Not on file    Review of Systems Unable to obtain secondary to AMS ____________________________________________   PHYSICAL EXAM:  VITAL SIGNS: ED Triage Vitals [01/09/17 1716]  Enc Vitals Group     BP 104/68     Pulse Rate 93     Resp 18     Temp 98.1 F (36.7 C)     Temp Source Oral     SpO2 96 %     Weight 185 lb (83.9 kg)     Height 5\' 8"  (1.727 m)    Constitutional: Intermittently awake and alert.  Eyes: Conjunctivae are normal. Normal extraocular movements. Pupils dilated but normally responsive.  ENT   Head: Normocephalic and atraumatic.   Nose: No congestion/rhinnorhea.   Mouth/Throat: Mucous membranes are moist.   Neck: No stridor. Hematological/Lymphatic/Immunilogical: No cervical lymphadenopathy. Cardiovascular: Normal rate, regular rhythm.   No murmurs, rubs, or gallops.  Respiratory: Normal respiratory effort without tachypnea nor retractions. Breath sounds are clear and equal bilaterally. No wheezes/rales/rhonchi. Gastrointestinal: Soft and non tender. No rebound. No guarding.  Genitourinary: Deferred Musculoskeletal: Normal range of motion in all extremities. No lower extremity edema. Neurologic:  Intermittently awake. When we attempted to move his phone off of his lap he became very aware and reached for his phone, however continued not to talk. He would track me occasionally.  Skin:  Skin is warm, dry and intact. No rash noted. Psychiatric: Mood and affect are normal. Speech and behavior are normal. Patient exhibits appropriate insight and judgment.  ____________________________________________    LABS (pertinent positives/negatives)  Labs Reviewed  COMPREHENSIVE METABOLIC PANEL - Abnormal; Notable for the following:       Result Value   Glucose, Bld 113 (*)    All other components within normal limits  URINE DRUG SCREEN, QUALITATIVE ([facility] ONLY) - Abnormal; Notable for the following:    Cannabinoid 50 Ng, Ur [place] POSITIVE (*)    All other components within normal limits  ETHANOL  CBC     ____________________________________________   EKG  I, Phineas SemenGraydon Brodey Bonn, attending physician, personally viewed and interpreted this EKG  EKG Time: 1721 Rate: 99 Rhythm: normal sinus rhythm Axis: right axis deviation Intervals: qtc 460 QRS: narrow ST changes: no st elevation Impression: abnormal ekg   ____________________________________________    RADIOLOGY  None   ____________________________________________   PROCEDURES  Procedures  ____________________________________________   INITIAL IMPRESSION / ASSESSMENT AND PLAN / ED COURSE  Pertinent labs & imaging results that were available during my care of the patient were reviewed by me and considered in my medical decision making (see chart for  details).  ----------------------------------------- 5:36 PM on 01/09/2017 -----------------------------------------  Patient presented to the emergency department via EMS for altered mental status. The patient's exam is quite interesting. He is nonverbal respond however he will clearly do some intentional movements. For example when we went to take his cell phone off of his lap he reached out and grabbed for it. This point certainly would seem that the patient has taken some mind altering substance. Alternatively the patient does have a self-reported psych history to his friends. Patient does have a charted history of having taken substances in the past. No evidence of any trauma on his head. Will plan on blood work and observation.  OBSERVATION CARE: This patient is being placed under observation care for the following reasons: Altered mental status secondary to likely substance abuse   ----------------------------------------- 9:24 PM on 01/09/2017 -----------------------------------------  Patient's vital signs continue to be stable. Patient continues to be nonverbal with occasional intentional ____________________________________________   FINAL CLINICAL IMPRESSION(S) / ED DIAGNOSES  Final diagnoses:  Abnormal behavior     Note: This dictation was prepared with Dragon dictation. Any transcriptional errors that result from this process are unintentional     Phineas Semen, MD 01/09/17 2241

## 2017-01-09 NOTE — ED Notes (Signed)
Pulse ox came off of pt's finger; pt was able to follow commands and stick out finger in order to place pulse ox back on. Pt will not respond to questions asked.

## 2017-01-09 NOTE — ED Triage Notes (Signed)
Per ems, pt called out for panic attack; pt responding to painful stimuli but was able to talk to ems; received narcan en route with some response; pt now responding to painful stimuli again; states he did not take anything

## 2017-01-10 MED ORDER — ACETAMINOPHEN 500 MG PO TABS
1000.0000 mg | ORAL_TABLET | Freq: Once | ORAL | Status: AC
  Administered 2017-01-10: 1000 mg via ORAL
  Filled 2017-01-10: qty 2

## 2017-01-10 NOTE — ED Notes (Signed)
Pt verbalized understanding of discharge instructions. NAD at this time. 

## 2017-01-10 NOTE — BH Assessment (Signed)
Writer was unable to complete TTS Consult. When he arrived to the room, patient had discharged.

## 2017-01-10 NOTE — ED Notes (Signed)
MD Veronese at bedside  

## 2017-01-10 NOTE — ED Notes (Signed)
Pt given 2 apple juices and water. Pt tolerated well.

## 2017-01-10 NOTE — Discharge Instructions (Signed)
You have been seen in the Emergency Department (ED)  today for a psychiatric complaint.  You have been evaluated by psychiatry and we believe you are safe to be discharged from the hospital.   ° °Please return to the Emergency Department (ED)  immediately if you have ANY thoughts of hurting yourself or anyone else, so that we may help you. ° °Please avoid alcohol and drug use. ° °Follow up with your doctor and/or therapist as soon as possible regarding today's ED  visit.  ° °You may call crisis hotline for [place] at [phone]. ° °

## 2017-01-10 NOTE — ED Notes (Signed)
Pt asked if he knew where he was, pt mumbled no. Pt oriented to location and asked for understanding on what brought him to the ED. Pt mumbled "I don't know." Pt then turns to opposite sides and ceases verbal responding.

## 2017-01-10 NOTE — ED Provider Notes (Signed)
-----------------------------------------   10:20 AM on 01/10/2017 -----------------------------------------   Blood pressure 124/75, pulse 81, temperature 98.1 F (36.7 C), temperature source Oral, resp. rate 20, height 5\' 8"  (1.727 m), weight 185 lb (83.9 kg), SpO2 99 %.  Assuming care from Dr. Pershing ProudSchaevitz of Rosann AuerbachJohnny R Litzenberger is a 31 y.o. male with a chief complaint of Drug Overdose .    31 year old male with a history of panic attacks and presented yesterday with abnormal behavior. Patient is now awake and alert. Tells me that he has a history of panic attacks. He left jail 2 days ago and has been going to a lot of stress as he does not have any family or anywhere to go. He is planning to go to a shelter. Yesterday he had a panic attack. He tells me that when he has panic attacks he develops this behavior of not talking and not responding. He denies any drug or alcohol. He did smoke marijuana 2 days ago. Patient is walking with a steady gait, holding normal conversation, refused a [place] sandwich and tells me that he is a picky eater and he would like to be discharged so he can go eat somewhere else. I will be discharging patient at this time. He is not suicidal or homicidal.   Don PerkingVeronese, [place], MD 01/10/17 1022

## 2017-01-10 NOTE — ED Notes (Signed)
Pt ambulated to toilet without difficulty. Pt alert and responding to questions at this time.

## 2017-01-10 NOTE — ED Notes (Signed)
Pt asked for food. Pt was offered a sandwich tray. Pt refused stating that he "is a picky eater and would like to leave so he can get his own food". MD informed of pt's ability to ambulate without difficulty and of his request for discharge.

## 2017-01-10 NOTE — ED Notes (Signed)
Pt asked by RN and ED tech to change into scrubs provided. Pt refused to change. At this time pt denies SI/HI and is not IVC. Pt belongings taken and MD notified. MD waiting for Psych consult to determine how to proceed.

## 2017-01-10 NOTE — ED Notes (Addendum)
Pt alert. Pt asked if he would answer questions. Pt nodded. Pt reports feeling "weak" and like he was still "going to pass out." Pt reports frequent "panic attacks" that result in pt feeling his heart racing, sweating, and "feeling like I am going to die." Pt also reports currently feeling like he can't wake up. Pt denies SI/HI

## 2017-01-10 NOTE — ED Notes (Signed)
Pt called out and asked for a blanket from another RN. Pt given blanket.

## 2017-01-10 NOTE — ED Notes (Signed)
Pt followed commands when asked to lay on back to take a blood pressure. Pt asked orientation questions but would not respond. Pt offered food, water, and toleiting. Before leaving the room pt was witnessed opening eyes and readjusting linen.

## 2017-01-10 NOTE — ED Notes (Signed)
Made pt aware I was putting their clothes in a pt belonging bag. Pt nodded. No other verbal response from pt.

## 2017-03-24 ENCOUNTER — Emergency Department
Admission: EM | Admit: 2017-03-24 | Discharge: 2017-03-24 | Disposition: A | Discharge status: Home / Self Care | Payer: Self-pay | Attending: Emergency Medicine | Admitting: Emergency Medicine

## 2017-03-24 DIAGNOSIS — Y939 Activity, unspecified: Secondary | ICD-10-CM | POA: Insufficient documentation

## 2017-03-24 DIAGNOSIS — Y999 Unspecified external cause status: Secondary | ICD-10-CM | POA: Insufficient documentation

## 2017-03-24 DIAGNOSIS — W57XXXA Bitten or stung by nonvenomous insect and other nonvenomous arthropods, initial encounter: Secondary | ICD-10-CM | POA: Insufficient documentation

## 2017-03-24 DIAGNOSIS — S30861A Insect bite (nonvenomous) of abdominal wall, initial encounter: Secondary | ICD-10-CM | POA: Insufficient documentation

## 2017-03-24 DIAGNOSIS — Y929 Unspecified place or not applicable: Secondary | ICD-10-CM | POA: Insufficient documentation

## 2017-03-24 DIAGNOSIS — F17210 Nicotine dependence, cigarettes, uncomplicated: Secondary | ICD-10-CM | POA: Insufficient documentation

## 2017-03-24 MED ORDER — SULFAMETHOXAZOLE-TRIMETHOPRIM 800-160 MG PO TABS: 1.0000 | ORAL_TABLET | Freq: Two times a day (BID) | ORAL | 0 refills | Status: DC

## 2017-03-24 MED ORDER — TRAMADOL HCL 50 MG PO TABS: 50.0000 mg | ORAL_TABLET | Freq: Four times a day (QID) | ORAL | 0 refills | Status: DC | PRN

## 2017-03-24 NOTE — ED Provider Notes (Signed)
[facility] Emergency Department Provider Note  ____________________________________________  Time seen: Approximately 6:20 PM  I have reviewed the triage vital signs and the nursing notes.   HISTORY  Chief Complaint Insect Bite    HPI Justin Ramirez is a 31 y.o. male who presents to emergency department complaining of possible spider bite to the lower abdominal wall. Patient reports that he did not feel any particular bite. This morning he woke up erythematous lesion under his umbilicus. Patient reports that his increased in size, pain. He reports that erythema is lesion now has a necrotic center. No drainage. No fevers or chills, nausea vomiting, diarrhea or constipation. No other injury or complaint at this time.   Past Medical History:  Diagnosis Date  . Panic attack     There are no active problems to display for this patient.   History reviewed. No pertinent surgical history.  Prior to Admission medications   Medication Sig Start Date End Date Taking? Authorizing Provider  sulfamethoxazole-trimethoprim (BACTRIM DS,SEPTRA DS) 800-160 MG tablet Take 1 tablet by mouth 2 (two) times daily. 03/24/17   Ariadna Setter, Delorise Royals, PA-C  traMADol (ULTRAM) 50 MG tablet Take 1 tablet (50 mg total) by mouth every 6 (six) hours as needed. 03/24/17   Mako Pelfrey, Delorise Royals, PA-C    Allergies Penicillins  No family history on file.  Social History Social History  Substance Use Topics  . Smoking status: Current Every Day Smoker    Types: Cigarettes  . Smokeless tobacco: Not on file  . Alcohol use Not on file     Review of Systems  Constitutional: No fever/chills Eyes: No visual changes.  Cardiovascular: no chest pain. Respiratory: no cough. No SOB. Gastrointestinal: No abdominal pain.  No nausea, no vomiting.  No diarrhea.  No constipation. Musculoskeletal: Negative for musculoskeletal pain. Skin: Negative for rash, abrasions, lacerations,  ecchymosis.Positive for possible spider bite to the lower abdominal wall. Neurological: Negative for headaches, focal weakness or numbness. 10-point ROS otherwise negative.  ____________________________________________   PHYSICAL EXAM:  VITAL SIGNS: ED Triage Vitals [03/24/17 1804]  Enc Vitals Group     BP 126/87     Pulse Rate 100     Resp 18     Temp 98.2 F (36.8 C)     Temp Source Oral     SpO2 100 %     Weight 185 lb (83.9 kg)     Height      Head Circumference      Peak Flow      Pain Score 10     Pain Loc      Pain Edu?      Excl. in GC?      Constitutional: Alert and oriented. Well appearing and in no acute distress. Eyes: Conjunctivae are normal. PERRL. EOMI. Head: Atraumatic. Neck: No stridor.    Cardiovascular: Normal rate, regular rhythm. Normal S1 and S2.  Good peripheral circulation. Respiratory: Normal respiratory effort without tachypnea or retractions. Lungs CTAB. Good air entry to the bases with no decreased or absent breath sounds. Musculoskeletal: Full range of motion to all extremities. No gross deformities appreciated. Neurologic:  Normal speech and language. No gross focal neurologic deficits are appreciated.  Skin:  Skin is warm, dry and intact. No rash noted. Erythematous lesion with central necrosis consistent with infected insect bite versus spider bite. Area is tender to palpation. Total erythematous lesion is approximately 3 cm in diameter. Necrosis approximately 0.5 cm in the middle of the lesion.  No induration or fluctuance. No drainage. Psychiatric: Mood and affect are normal. Speech and behavior are normal. Patient exhibits appropriate insight and judgement.   ____________________________________________   LABS (all labs ordered are listed, but only abnormal results are displayed)  Labs Reviewed - No data to display ____________________________________________  EKG   ____________________________________________  RADIOLOGY   No  results found.  ____________________________________________    PROCEDURES  Procedure(s) performed:    Procedures    Medications - No data to display   ____________________________________________   INITIAL IMPRESSION / ASSESSMENT AND PLAN / ED COURSE  Pertinent labs & imaging results that were available during my care of the patient were reviewed by me and considered in my medical decision making (see chart for details).  Review of the [place] CSRS was performed in accordance of the [facility] prior to dispensing any controlled drugs.     Patient's diagnosis is consistent with infected insect bite versus brown recluse spider bite. No induration or fluctuance. No indication for labs or imaging at this time.. Patient will be discharged home with prescriptions for antibiotics very limited pain medication. Patient is to follow up with primary care as needed or otherwise directed. Patient is given ED precautions to return to the ED for any worsening or new symptoms.     ____________________________________________  FINAL CLINICAL IMPRESSION(S) / ED DIAGNOSES  Final diagnoses:  Bug bite with infection, initial encounter      NEW MEDICATIONS STARTED DURING THIS VISIT:  New Prescriptions   SULFAMETHOXAZOLE-TRIMETHOPRIM (BACTRIM DS,SEPTRA DS) 800-160 MG TABLET    Take 1 tablet by mouth 2 (two) times daily.   TRAMADOL (ULTRAM) 50 MG TABLET    Take 1 tablet (50 mg total) by mouth every 6 (six) hours as needed.        This chart was dictated using voice recognition software/Dragon. Despite best efforts to proofread, errors can occur which can change the meaning. Any change was purely unintentional.    Racheal PatchesCuthriell, Delbert Vu D, PA-C 03/24/17 Quinn [id number]    Goodman, Graydon, MD 03/24/17 [phone]

## 2017-03-24 NOTE — ED Triage Notes (Addendum)
Reddened circular area under umbilicus. Pt thinks he was bitten by a spider. Awoke with area. Black center and redness around it, approx silver dollar size. Pt alert and oriented X4, active, cooperative, pt in NAD. RR even and unlabored, color WNL.

## 2017-03-24 NOTE — ED Notes (Signed)
See triage note   States he thinks he may have been stung or bitten by something this am  Noticed a red area to abd with black center   states area is very painful

## 2017-11-15 ENCOUNTER — Other Ambulatory Visit: Payer: Self-pay

## 2017-11-15 ENCOUNTER — Encounter: Payer: Self-pay | Admitting: Emergency Medicine

## 2017-11-15 ENCOUNTER — Emergency Department: Payer: Self-pay

## 2017-11-15 ENCOUNTER — Emergency Department
Admission: EM | Admit: 2017-11-15 | Discharge: 2017-11-15 | Discharge status: Against Medical Advice | Payer: Self-pay | Attending: Emergency Medicine | Admitting: Emergency Medicine

## 2017-11-15 DIAGNOSIS — F17210 Nicotine dependence, cigarettes, uncomplicated: Secondary | ICD-10-CM | POA: Insufficient documentation

## 2017-11-15 DIAGNOSIS — Z79899 Other long term (current) drug therapy: Secondary | ICD-10-CM | POA: Insufficient documentation

## 2017-11-15 DIAGNOSIS — F410 Panic disorder [episodic paroxysmal anxiety] without agoraphobia: Secondary | ICD-10-CM | POA: Insufficient documentation

## 2017-11-15 DIAGNOSIS — F19921 Other psychoactive substance use, unspecified with intoxication with delirium: Secondary | ICD-10-CM | POA: Insufficient documentation

## 2017-11-15 LAB — TROPONIN I

## 2017-11-15 LAB — CBC
HCT: 46 % (ref 40.0–52.0)
Hemoglobin: 15.8 g/dL (ref 13.0–18.0)
MCH: 30.6 pg (ref 26.0–34.0)
MCHC: 34.3 g/dL (ref 32.0–36.0)
MCV: 89 fL (ref 80.0–100.0)
Platelets: 290 10*3/uL (ref 150–440)
RBC: 5.18 MIL/uL (ref 4.40–5.90)
RDW: 13.4 % (ref 11.5–14.5)
WBC: 6.1 10*3/uL (ref 3.8–10.6)

## 2017-11-15 LAB — COMPREHENSIVE METABOLIC PANEL
ALT: 13 U/L — ABNORMAL LOW (ref 17–63)
ANION GAP: 8 (ref 5–15)
AST: 21 U/L (ref 15–41)
Albumin: 4 g/dL (ref 3.5–5.0)
Alkaline Phosphatase: 76 U/L (ref 38–126)
BUN: 7 mg/dL (ref 6–20)
CHLORIDE: 103 mmol/L (ref 101–111)
CO2: 28 mmol/L (ref 22–32)
CREATININE: 0.89 mg/dL (ref 0.61–1.24)
Calcium: 9.3 mg/dL (ref 8.9–10.3)
Glucose, Bld: 86 mg/dL (ref 65–99)
POTASSIUM: 3.4 mmol/L — AB (ref 3.5–5.1)
SODIUM: 139 mmol/L (ref 135–145)
Total Bilirubin: 0.5 mg/dL (ref 0.3–1.2)
Total Protein: 7 g/dL (ref 6.5–8.1)

## 2017-11-15 MED ORDER — LORAZEPAM 1 MG PO TABS
1.0000 mg | ORAL_TABLET | Freq: Once | ORAL | Status: DC
  Filled 2017-11-15: qty 1

## 2017-11-15 MED ORDER — LORAZEPAM 0.5 MG PO TABS
0.5000 mg | ORAL_TABLET | Freq: Once | ORAL | Status: AC
  Administered 2017-11-15: 0.5 mg via ORAL

## 2017-11-15 NOTE — ED Notes (Signed)
Patient transported to X-ray 

## 2017-11-15 NOTE — ED Notes (Signed)
Pt eloped. Unable to sign

## 2017-11-15 NOTE — ED Notes (Signed)
This RN in room to discharge patient. Pt gown on bed, patient not in room. This RN went out to lobby to attempt to locate pt. Pt not in lobby.

## 2017-11-15 NOTE — ED Notes (Signed)
[employer] officer at bedside speaking with patient per MD's request.  Per officer patient is calm, cooperative, and does not seem to pose any concerns to be able to leave.
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
## 2017-11-15 NOTE — ED Notes (Signed)
MD at bedside. 

## 2017-11-15 NOTE — ED Notes (Signed)
Upon assessing patient with Dr. Pershing ProudSchaevitz, patient continuously cursing about c/o.  Patient states "all I want is to come down and go home".  Dr. Pershing ProudSchaevitz ordered ativan PO and chest xray.

## 2017-11-15 NOTE — ED Provider Notes (Signed)
[facility] Emergency Department Provider Note  ___________________________________________   First MD Initiated Contact with Patient 11/15/17 1106     (approximate)  I have reviewed the triage vital signs and the nursing notes.   HISTORY  Chief Complaint Anxiety and Shortness of Breath   HPI Justin Ramirez is a 32 y.o. male without any chronic medical conditions was presenting to the emergency department today with shortness of breath, hallucinations and anxiety after smoking marijuana and what he believes to be meth last night.  He says that he was just trying to smoke marijuana but believes that the joint was laced with something else.  He says that several other people at his party had similar symptoms.  However, he says that his symptoms continue this morning.  He says that he also has a dry mouth and feels like his "glands are swollen."  He points to the front of his throat when he is describing these "glands."  He also said that he feels like he is going to "kill" whoever gave him the lace joint effusion find out who they were.  Past Medical History:  Diagnosis Date  . Panic attack     There are no active problems to display for this patient.   History reviewed. No pertinent surgical history.  Prior to Admission medications   Medication Sig Start Date End Date Taking? Authorizing Provider  sulfamethoxazole-trimethoprim (BACTRIM DS,SEPTRA DS) 800-160 MG tablet Take 1 tablet by mouth 2 (two) times daily. 03/24/17   Cuthriell, Delorise RoyalsJonathan D, PA-C  traMADol (ULTRAM) 50 MG tablet Take 1 tablet (50 mg total) by mouth every 6 (six) hours as needed. 03/24/17   Cuthriell, Delorise RoyalsJonathan D, PA-C    Allergies Penicillins  No family history on file.  Social History Social History   Tobacco Use  . Smoking status: Current Every Day Smoker    Types: Cigarettes  Substance Use Topics  . Alcohol use: Not on file  . Drug use: Not on file    Review of  Systems  Constitutional: No fever/chills Eyes: No visual changes. ENT: No sore throat. Cardiovascular: Denies chest pain. Respiratory: Denies shortness of breath. Gastrointestinal: No abdominal pain.  No nausea, no vomiting.  No diarrhea.  No constipation. Genitourinary: Negative for dysuria. Musculoskeletal: Negative for back pain. Skin: Negative for rash. Neurological: Negative for headaches, focal weakness or numbness.   ____________________________________________   PHYSICAL EXAM:  VITAL SIGNS: ED Triage Vitals  Enc Vitals Group     BP 11/15/17 0913 119/75     Pulse Rate 11/15/17 0913 88     Resp 11/15/17 0913 20     Temp 11/15/17 0913 98 F (36.7 C)     Temp Source 11/15/17 0913 Oral     SpO2 11/15/17 0913 100 %     Weight 11/15/17 0914 180 lb (81.6 kg)     Height 11/15/17 0914 6\' 1"  (1.854 m)     Head Circumference --      Peak Flow --      Pain Score 11/15/17 0913 10     Pain Loc --      Pain Edu? --      Excl. in GC? --     Constitutional: Alert and oriented. Well appearing and in no acute distress. Eyes: Conjunctivae are normal.  Head: Atraumatic. Nose: No congestion/rhinnorhea. Mouth/Throat: Mucous membranes are moist.  No pharyngeal erythema.  No tonsillar swelling. Neck: No stridor.  I do not palpate any swollen lymph nodes to  the anterior neck.  There is no gross swelling or deformity of the neck. Cardiovascular: Normal rate, regular rhythm. Grossly normal heart sounds.   Respiratory: Normal respiratory effort.  No retractions. Lungs CTAB. Gastrointestinal: Soft and nontender. No distention.  Musculoskeletal: No lower extremity tenderness nor edema.  No joint effusions. Neurologic:  Normal speech and language. No gross focal neurologic deficits are appreciated. Skin:  Skin is warm, dry and intact. No rash noted. Psychiatric: Mood and affect are normal. Speech and behavior are normal.  ____________________________________________   LABS (all labs  ordered are listed, but only abnormal results are displayed)  Labs Reviewed  COMPREHENSIVE METABOLIC PANEL - Abnormal; Notable for the following components:      Result Value   Potassium 3.4 (*)    ALT 13 (*)    All other components within normal limits  CBC  TROPONIN I   ____________________________________________  EKG  ED ECG REPORT I, Arelia Longest, the attending physician, personally viewed and interpreted this ECG.   Date: 11/15/2017  EKG Time: 0918  Rate: 69  Rhythm: normal sinus rhythm  Axis: Rightward  Intervals:none  ST&T Change: No ST segment elevation or depression.  No abnormal T wave inversion.  ____________________________________________  RADIOLOGY  No acute finding on the chest x-ray ____________________________________________   PROCEDURES  Procedure(s) performed:   Procedures  Critical Care performed:   ____________________________________________   INITIAL IMPRESSION / ASSESSMENT AND PLAN / ED COURSE  Pertinent labs & imaging results that were available during my care of the patient were reviewed by me and considered in my medical decision making (see chart for details).  Differential includes, but is not limited to, viral syndrome, bronchitis including COPD exacerbation, pneumonia, reactive airway disease including asthma, CHF including exacerbation with or without pulmonary/interstitial edema, pneumothorax, ACS, thoracic trauma, and pulmonary embolism. As part of my medical decision making, I reviewed the following data within the electronic MEDICAL RECORD NUMBER Notes from prior ED visits  ----------------------------------------- 1:14 PM on 11/15/2017 -----------------------------------------  Had officer, Dr. Melrose Nakayama, speak with the patient.  The patient is more calm now.  He is denying any suicidal, homicidal ideation.  I reevaluated the patient and he is calm, lying in bed.  He does not appear to be responding to any internal stimuli.   He has good insight into his disease process.  He is saying that after resting that he is feeling better and he feels like he just needs to eat something.  Patient will be discharged at this time. ____________________________________________   FINAL CLINICAL IMPRESSION(S) / ED DIAGNOSES  Recreational drug use.  Intoxication, resolved.    NEW MEDICATIONS STARTED DURING THIS VISIT:  New Prescriptions   No medications on file     Note:  This document was prepared using Dragon voice recognition software and may include unintentional dictation errors.     Myrna Blazer, MD 11/15/17 1314

## 2018-03-21 ENCOUNTER — Emergency Department
Admission: EM | Admit: 2018-03-21 | Discharge: 2018-03-21 | Disposition: A | Discharge status: Home / Self Care | Payer: Self-pay | Attending: Emergency Medicine | Admitting: Emergency Medicine

## 2018-03-21 ENCOUNTER — Other Ambulatory Visit: Payer: Self-pay

## 2018-03-21 DIAGNOSIS — S61214A Laceration without foreign body of right ring finger without damage to nail, initial encounter: Secondary | ICD-10-CM | POA: Insufficient documentation

## 2018-03-21 DIAGNOSIS — Y999 Unspecified external cause status: Secondary | ICD-10-CM | POA: Insufficient documentation

## 2018-03-21 DIAGNOSIS — Y939 Activity, unspecified: Secondary | ICD-10-CM | POA: Insufficient documentation

## 2018-03-21 DIAGNOSIS — F17210 Nicotine dependence, cigarettes, uncomplicated: Secondary | ICD-10-CM | POA: Insufficient documentation

## 2018-03-21 DIAGNOSIS — Y929 Unspecified place or not applicable: Secondary | ICD-10-CM | POA: Insufficient documentation

## 2018-03-21 DIAGNOSIS — W25XXXA Contact with sharp glass, initial encounter: Secondary | ICD-10-CM | POA: Insufficient documentation

## 2018-03-21 MED ORDER — LIDOCAINE HCL (PF) 1 % IJ SOLN
5.0000 mL | Freq: Once | INTRAMUSCULAR | Status: AC
  Administered 2018-03-21: 5 mL
  Filled 2018-03-21: qty 5

## 2018-03-21 NOTE — ED Provider Notes (Signed)
[facility] Emergency Department Provider Note  ____________________________________________   First MD Initiated Contact with Patient 03/21/18 1210     (approximate)  I have reviewed the triage vital signs and the nursing notes.   HISTORY  Chief Complaint Laceration    HPI Justin Ramirez is a 32 y.o. male presents emergency department complaining of laceration to the right fourth finger.  He states he was moving a picture frame that had broken glass and he cut himself.  He is tetanus is up-to-date.  Past Medical History:  Diagnosis Date  . Panic attack     There are no active problems to display for this patient.   No past surgical history on file.  Prior to Admission medications   Medication Sig Start Date End Date Taking? Authorizing Provider  sulfamethoxazole-trimethoprim (BACTRIM DS,SEPTRA DS) 800-160 MG tablet Take 1 tablet by mouth 2 (two) times daily. 03/24/17   Cuthriell, Delorise Royals, PA-C  traMADol (ULTRAM) 50 MG tablet Take 1 tablet (50 mg total) by mouth every 6 (six) hours as needed. 03/24/17   Cuthriell, Delorise Royals, PA-C    Allergies Penicillins  No family history on file.  Social History Social History   Tobacco Use  . Smoking status: Current Every Day Smoker    Types: Cigarettes  Substance Use Topics  . Alcohol use: Not on file  . Drug use: Not on file    Review of Systems  Constitutional: No fever/chills Eyes: No visual changes. ENT: No sore throat. Respiratory: Denies cough Genitourinary: Negative for dysuria. Musculoskeletal: Negative for back pain.  Positive for laceration to the right fourth finger Skin: Negative for rash.  Positive laceration    ____________________________________________   PHYSICAL EXAM:  VITAL SIGNS: ED Triage Vitals  Enc Vitals Group     BP 03/21/18 1121 121/69     Pulse Rate 03/21/18 1121 81     Resp 03/21/18 1121 18     Temp 03/21/18 1121 98.2 F (36.8 C)     Temp Source  03/21/18 1121 Oral     SpO2 03/21/18 1121 100 %     Weight --      Height --      Head Circumference --      Peak Flow --      Pain Score 03/21/18 1122 3     Pain Loc --      Pain Edu? --      Excl. in GC? --     Constitutional: Alert and oriented. Well appearing and in no acute distress. Eyes: Conjunctivae are normal.  Head: Atraumatic. Nose: No congestion/rhinnorhea. Mouth/Throat: Mucous membranes are moist.   Cardiovascular: Normal rate, regular rhythm. Respiratory: Normal respiratory effort.  No retractions GU: deferred Musculoskeletal: FROM all extremities, warm and well perfused.  Positive for a 1 cm laceration to the distal aspect of the right fourth finger, no foreign bodies noted. Neurologic:  Normal speech and language.  Skin:  Skin is warm, dry, positive for 1 cm laceration to the right fourth finger  psychiatric: Mood and affect are normal. Speech and behavior are normal.  ____________________________________________   LABS (all labs ordered are listed, but only abnormal results are displayed)  Labs Reviewed - No data to display ____________________________________________   ____________________________________________  RADIOLOGY    ____________________________________________   PROCEDURES  Procedure(s) performed:   Marland KitchenMarland KitchenLaceration Repair Date/Time: 03/21/2018 3:15 PM Performed by: Faythe Ghee, PA-C Authorized by: Faythe Ghee, PA-C   Consent:    Consent  obtained:  Verbal   Consent given by:  Patient   Risks discussed:  Infection, pain, poor cosmetic result, retained foreign body, tendon damage and poor wound healing   Alternatives discussed:  No treatment Anesthesia (see MAR for exact dosages):    Anesthesia method:  Nerve block   Block anesthetic:  Lidocaine 1% w/o epi   Block outcome:  Anesthesia achieved Laceration details:    Location:  Finger   Finger location:  R ring finger   Length (cm):  1   Depth (mm):  2 Repair type:     Repair type:  Simple Pre-procedure details:    Preparation:  Patient was prepped and draped in usual sterile fashion Exploration:    Hemostasis achieved with:  Direct pressure   Wound exploration: wound explored through full range of motion     Wound extent: no foreign bodies/material noted, no nerve damage noted, no tendon damage noted and no underlying fracture noted     Contaminated: no   Treatment:    Area cleansed with:  Betadine and saline   Amount of cleaning:  Standard   Irrigation solution:  Sterile saline   Irrigation method:  Tap   Visualized foreign bodies/material removed: no   Skin repair:    Repair method:  Sutures   Suture size:  5-0   Suture material:  Nylon   Suture technique:  Simple interrupted   Number of sutures:  3 Approximation:    Approximation:  Close Post-procedure details:    Dressing:  Antibiotic ointment and non-adherent dressing   Patient tolerance of procedure:  Tolerated well, no immediate complications Comments:     Sutures were placed by the [employer] student, Carollee HerterShannon      ____________________________________________   INITIAL IMPRESSION / ASSESSMENT AND PLAN / ED COURSE  Pertinent labs & imaging results that were available during my care of the patient were reviewed by me and considered in my medical decision making (see chart for details).  Patient is 32 year old male presents emergency department complaint of laceration to the right fourth finger.  He is a picture frame that had broken glass and cut his finger.  His tetanus is up-to-date  On physical exam the right fourth finger has a laceration on the volar aspect, no foreign body is noted  The area was closed with 5-0 Ethilon after a digital block was placed.  3 simple sutures were placed.  Patient tolerated procedure well.  The patient was instructed on how to care for the sutures.  He is to have the removed in 7 days.  He states he understands will comply with our instructions.  Was  discharged in stable condition     As part of my medical decision making, I reviewed the following data within the electronic MEDICAL RECORD NUMBER Nursing notes reviewed and incorporated, Notes from prior ED visits and [place] Controlled Substance Database  ____________________________________________   FINAL CLINICAL IMPRESSION(S) / ED DIAGNOSES  Final diagnoses:  Laceration of right ring finger without foreign body without damage to nail, initial encounter      NEW MEDICATIONS STARTED DURING THIS VISIT:  Discharge Medication List as of 03/21/2018  1:28 PM       Note:  This document was prepared using Dragon voice recognition software and may include unintentional dictation errors.    Faythe GheeFisher, Stiven Kaspar W, PA-C 03/21/18 1518    Jeanmarie PlantMcShane, James A, MD 03/21/18 [phone]

## 2018-03-21 NOTE — Discharge Instructions (Addendum)
Follow-up in 7 days for suture removal.  You may remove these at home if you feel comfortable.  If he see any sign of redness or swelling please return the emergency department for evaluation of infection.  Wash the area daily with soap and water.  He can keep a Band-Aid on the area starting tomorrow.

## 2018-03-21 NOTE — ED Triage Notes (Signed)
Pt states that he hit his hand on a piece of broken glass this am, pt has a lac to his right 4 th finger. Gauze applied due to oozing

## 2018-08-30 ENCOUNTER — Emergency Department: Payer: Self-pay

## 2018-08-30 ENCOUNTER — Other Ambulatory Visit: Payer: Self-pay

## 2018-08-30 ENCOUNTER — Emergency Department
Admission: EM | Admit: 2018-08-30 | Discharge: 2018-08-30 | Disposition: A | Discharge status: Home / Self Care | Payer: Self-pay | Attending: Emergency Medicine | Admitting: Emergency Medicine

## 2018-08-30 DIAGNOSIS — S0990XA Unspecified injury of head, initial encounter: Secondary | ICD-10-CM

## 2018-08-30 DIAGNOSIS — M542 Cervicalgia: Secondary | ICD-10-CM | POA: Insufficient documentation

## 2018-08-30 DIAGNOSIS — Y939 Activity, unspecified: Secondary | ICD-10-CM | POA: Insufficient documentation

## 2018-08-30 DIAGNOSIS — Y999 Unspecified external cause status: Secondary | ICD-10-CM | POA: Insufficient documentation

## 2018-08-30 DIAGNOSIS — Y92149 Unspecified place in prison as the place of occurrence of the external cause: Secondary | ICD-10-CM | POA: Insufficient documentation

## 2018-08-30 DIAGNOSIS — Z23 Encounter for immunization: Secondary | ICD-10-CM | POA: Insufficient documentation

## 2018-08-30 DIAGNOSIS — S0101XA Laceration without foreign body of scalp, initial encounter: Secondary | ICD-10-CM | POA: Insufficient documentation

## 2018-08-30 DIAGNOSIS — F17210 Nicotine dependence, cigarettes, uncomplicated: Secondary | ICD-10-CM | POA: Insufficient documentation

## 2018-08-30 HISTORY — DX: Systemic involvement of connective tissue, unspecified: M35.9

## 2018-08-30 LAB — BASIC METABOLIC PANEL
ANION GAP: 8 (ref 5–15)
BUN: 15 mg/dL (ref 6–20)
CO2: 28 mmol/L (ref 22–32)
Calcium: 9.6 mg/dL (ref 8.9–10.3)
Chloride: 102 mmol/L (ref 98–111)
Creatinine, Ser: 0.88 mg/dL (ref 0.61–1.24)
Glucose, Bld: 122 mg/dL — ABNORMAL HIGH (ref 70–99)
POTASSIUM: 3.8 mmol/L (ref 3.5–5.1)
SODIUM: 138 mmol/L (ref 135–145)

## 2018-08-30 LAB — CBC WITH DIFFERENTIAL/PLATELET
ABS IMMATURE GRANULOCYTES: 0.03 10*3/uL (ref 0.00–0.07)
BASOS PCT: 0 %
Basophils Absolute: 0 10*3/uL (ref 0.0–0.1)
EOS PCT: 0 %
Eosinophils Absolute: 0 10*3/uL (ref 0.0–0.5)
HCT: 42.6 % (ref 39.0–52.0)
Hemoglobin: 14.8 g/dL (ref 13.0–17.0)
Immature Granulocytes: 0 %
Lymphocytes Relative: 16 %
Lymphs Abs: 1.2 10*3/uL (ref 0.7–4.0)
MCH: 30.3 pg (ref 26.0–34.0)
MCHC: 34.7 g/dL (ref 30.0–36.0)
MCV: 87.1 fL (ref 80.0–100.0)
MONO ABS: 0.7 10*3/uL (ref 0.1–1.0)
Monocytes Relative: 9 %
Neutro Abs: 5.7 10*3/uL (ref 1.7–7.7)
Neutrophils Relative %: 75 %
PLATELETS: 246 10*3/uL (ref 150–400)
RBC: 4.89 MIL/uL (ref 4.22–5.81)
RDW: 12.1 % (ref 11.5–15.5)
WBC: 7.7 10*3/uL (ref 4.0–10.5)
nRBC: 0 % (ref 0.0–0.2)

## 2018-08-30 MED ORDER — TETANUS-DIPHTH-ACELL PERTUSSIS 5-2.5-18.5 LF-MCG/0.5 IM SUSP
0.5000 mL | Freq: Once | INTRAMUSCULAR | Status: AC
  Administered 2018-08-30: 0.5 mL via INTRAMUSCULAR
  Filled 2018-08-30: qty 0.5

## 2018-08-30 MED ORDER — IOHEXOL 300 MG/ML  SOLN
75.0000 mL | Freq: Once | INTRAMUSCULAR | Status: AC | PRN
  Administered 2018-08-30: 75 mL via INTRAVENOUS
  Filled 2018-08-30: qty 75

## 2018-08-30 MED ORDER — KETOROLAC TROMETHAMINE 30 MG/ML IJ SOLN
30.0000 mg | Freq: Once | INTRAMUSCULAR | Status: AC
  Administered 2018-08-30: 30 mg via INTRAVENOUS
  Filled 2018-08-30: qty 1

## 2018-08-30 MED ORDER — BACITRACIN-NEOMYCIN-POLYMYXIN 400-5-5000 EX OINT
TOPICAL_OINTMENT | Freq: Once | CUTANEOUS | Status: AC
  Administered 2018-08-30: 1 via TOPICAL
  Filled 2018-08-30: qty 1
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
## 2018-08-30 NOTE — Discharge Instructions (Signed)
Clean the area with soap and water daily.  The staples should be removed in 10 days.  If there is any sign of infection please return the emergency department or [facility] jail.
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
## 2018-08-30 NOTE — ED Notes (Signed)
Pt has R foot turned inward; complaining of severe pain from foot up into thigh; says its like an awful cramp; can move foot around; pulse 2+; PA notified.

## 2020-03-02 ENCOUNTER — Other Ambulatory Visit: Payer: Self-pay

## 2020-03-02 ENCOUNTER — Emergency Department
Admission: EM | Admit: 2020-03-02 | Discharge: 2020-03-02 | Disposition: A | Discharge status: Home / Self Care | Payer: Self-pay | Attending: Emergency Medicine | Admitting: Emergency Medicine

## 2020-03-02 ENCOUNTER — Encounter: Payer: Self-pay | Admitting: Emergency Medicine

## 2020-03-02 ENCOUNTER — Emergency Department: Payer: Self-pay

## 2020-03-02 DIAGNOSIS — F17210 Nicotine dependence, cigarettes, uncomplicated: Secondary | ICD-10-CM | POA: Insufficient documentation

## 2020-03-02 DIAGNOSIS — M79604 Pain in right leg: Secondary | ICD-10-CM | POA: Insufficient documentation

## 2020-03-02 DIAGNOSIS — Z0279 Encounter for issue of other medical certificate: Secondary | ICD-10-CM | POA: Insufficient documentation

## 2020-03-02 DIAGNOSIS — Z880 Allergy status to penicillin: Secondary | ICD-10-CM | POA: Insufficient documentation

## 2020-03-02 MED ORDER — KETOROLAC TROMETHAMINE 30 MG/ML IJ SOLN
30.0000 mg | Freq: Once | INTRAMUSCULAR | Status: DC
  Filled 2020-03-02: qty 1
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
## 2020-03-02 NOTE — Discharge Instructions (Addendum)
Please follow-up with the prison doctor return as needed.
